# Patient Record
Sex: Male | Born: 1955
Health system: Southern US, Community
[De-identification: ages and names within clinical notes are randomized; demographics above are authoritative.]

---

## 2010-01-21 ENCOUNTER — Ambulatory Visit: Payer: Self-pay | Admitting: Family Medicine

## 2010-01-21 DIAGNOSIS — K219 Gastro-esophageal reflux disease without esophagitis: Secondary | ICD-10-CM | POA: Insufficient documentation

## 2010-02-12 ENCOUNTER — Ambulatory Visit: Payer: Self-pay

## 2010-02-12 ENCOUNTER — Ambulatory Visit (HOSPITAL_COMMUNITY): Admission: RE | Admit: 2010-02-12 | Discharge: 2010-02-12 | Payer: Self-pay | Admitting: Family Medicine

## 2010-02-12 DIAGNOSIS — M25579 Pain in unspecified ankle and joints of unspecified foot: Secondary | ICD-10-CM

## 2010-02-16 ENCOUNTER — Encounter: Payer: Self-pay | Admitting: Family Medicine

## 2010-02-22 ENCOUNTER — Ambulatory Visit: Payer: Self-pay | Admitting: Family Medicine

## 2010-02-24 ENCOUNTER — Ambulatory Visit: Payer: Self-pay | Admitting: Family Medicine

## 2010-02-24 DIAGNOSIS — F411 Generalized anxiety disorder: Secondary | ICD-10-CM | POA: Insufficient documentation

## 2010-02-24 DIAGNOSIS — R030 Elevated blood-pressure reading, without diagnosis of hypertension: Secondary | ICD-10-CM

## 2010-03-01 ENCOUNTER — Encounter: Payer: Self-pay | Admitting: Family Medicine

## 2010-08-31 NOTE — Assessment & Plan Note (Signed)
Summary: ankle pain/kh   Vital Signs:  Patient profile:   55 year old male Height:      71 inches Weight:      188 pounds BP sitting:   147 / 92  Vitals Entered By: Lillia Pauls CMA (February 12, 2010 8:51 AM)  History of Present Illness: Right ankle pain many years Previous ORIF surgery  Constant severe stabbing and burning pain right ankle and right achilles tendon, worse with walking or standong. Uses a wrap and ice with mild relief. rest helps. Takes ibuprofen for pain, still it occasionally awakens him from sleep.  Problems Prior to Update: 1)  Gerd  (ICD-530.81) 2)  Health Screening  (ICD-V70.0)  Current Medications (verified): 1)  Nexium 40 Mg Cpdr (Esomeprazole Magnesium) .Marland Kitchen.. 1 Tab By Mouth Qhs 2)  Ibuprofen 600 Mg Tabs (Ibuprofen) .... Qid As Needed Ankle Pain  Review of Systems       no numbness in toes or feet. no color changes noted in feet.  Physical Exam  General:  alert, well-developed, well-nourished, and well-hydrated.   Additional Exam:  [place] left achilles is smaller than rigt (see saved [place] images for details) and is heterogenous in signal c/w scar and previous partial tear.   Foot/Ankle Exam  Ankle Exam:    Right:    Inspection:  Abnormal    Tenderness:  Achilles TTP    mild ST swelling anteromedially, not an effusion.     Range of Motion:       Dorsiflex-Active: 45       Plantar Flex-Active: 50       Eversion-Passive: 25       Inversion-Passive: 45    Left:    Range of Motion:       Eversion-Passive: 30       Inversion-Passive:  60   Impression & Recommendations:  Problem # 1:  ANKLE PAIN (ICD-719.47)  Orders: Radiology other (Radiology Other) will get films to eval from previous surgery [place] revelas chronic achilles tendinopathy--set up with achilles strengthening program rtc 4 w  Complete Medication List: 1)  Nexium 40 Mg Cpdr (Esomeprazole magnesium) .Marland Kitchen.. 1 tab by mouth qhs 2)  Ibuprofen 600 Mg Tabs (Ibuprofen) .... Qid as needed ankle  pain

## 2010-08-31 NOTE — Assessment & Plan Note (Signed)
Summary: camp physical,tcb   Vital Signs:  Patient profile:   55 year old male Height:      70.25 inches Weight:      187.0 pounds BMI:     26.74 Temp:     97.4 degrees F oral Pulse rate:   76 / minute BP sitting:   140 / 86  (left arm) Cuff size:   regular  Vitals Entered By: Gladstone Pih (January 21, 2010 11:23 AM) CC: Camp PE Is Patient Diabetic? No Pain Assessment Patient in pain? no        CC:  Camp PE.  History of Present Illness: new patient here for form to be filled out: [employer]  Only issues: 1) some MSK complaints of chronic but non-debillitating ankle pain. 2) some chronic throat clearing and non productive cough for several months. has used OTC prilosec intermittently  for indigenstion but does not think he has that every day. gHas a lot of post nasal drainage-esp noted in AM.  Habits & Providers  Alcohol-Tobacco-Diet     Tobacco Status: quit > 6 months  Comments: quit smoking 1983  Current Medications (verified): 1)  Nexium 40 Mg Cpdr (Esomeprazole Magnesium) .Marland Kitchen.. 1 Tab By Mouth Qhs  Past History:  Past Medical History: no prior hospitalizations  Past Surgical History: no surgeries  Family History: HTN father colon polyps (?cancer?) father Both parents alive at age 2 (2011)  Social History: Dory Larsen. Lives with wife and two [place], Gerilyn Pilgr[place] and [place]. has several cats, a dog and a pig, HS graduate previous smoker quit 1983. no alcohol or ollicits Smoking Status:  quit > 6 months  Review of Systems       Please see HPI for additional ROS.   Physical Exam  General:  alert, well-developed, well-nourished, and well-hydrated.   Eyes:  vision grossly intact, pupils equal, pupils round, and pupils reactive to light.   Ears:  R ear normal and L ear normal.   Nose:  no external deformity.   Mouth:  good dentition.   Neck:  supple, full ROM, no masses, no thyromegaly, and normal carotid upstroke.   Lungs:  normal  respiratory effort and normal breath sounds.   Heart:  normal rate, regular rhythm, and no murmur.   Abdomen:  soft, non-tender, and normal bowel sounds.   Rectal:  deferred Genitalia:  deferred Prostate:  deferred Msk:   strength normal in all extremities, and gait normal Neurologic:  alert & oriented X3,.  no gross focal deficit Psych:  Oriented X3, normally interactive, good eye contact, not anxious appearing, and not depressed appearing.     Impression & Recommendations:  Problem # 1:  HEALTH SCREENING (ICD-V70.0) filled out his forms see him in [facility] for follow up on his chronic ankle pain  Problem # 2:  GERD (ICD-530.81) I think his cough is combo if reflux and allergic rhinitis--will do a trial of PPI and see him back one month for further eval  Complete Medication List: 1)  Nexium 40 Mg Cpdr (Esomeprazole magnesium) .Marland Kitchen.. 1 tab by mouth qhs  Patient Instructions: 1)  Please schedule Mr terlizzi back to see me here at [facility] in 4-6 weeks 2)  Please schedukle him at Sports Medicine with me at next avaialbe for ankle pain Prescriptions: NEXIUM 40 MG CPDR (ESOMEPRAZOLE MAGNESIUM) 1 tab by mouth qhs  #90 x 3   Entered and Authorized by:   Denny Levy MD   Signed by:   Denny Levy MD  on 01/21/2010   Method used:   Electronically to        [facility]* (retail)       [address]       [address] Shipping/mailing       [place], [place]  [address]       Ph: [phone]       Fax: [phone]   RxID:   [id number]   Prevention & Chronic Care Immunizations   Influenza vaccine: Not documented    Tetanus booster: Not documented    Pneumococcal vaccine: Not documented  Colorectal Screening   Hemoccult: Not documented    Colonoscopy: 2008 normal by report of patient  (01/21/2010)  Other Screening   PSA: Not documented  Reports requested:   Last colonoscopy report requested.  Smoking status: quit > 6 months  (01/21/2010)  Lipids   Total  Cholesterol: Not documented   LDL: Not documented   LDL Direct: Not documented   HDL: Not documented   Triglycerides: Not documented   Nursing Instructions: Request report of last colonoscopy     Impression & Recommendations:  His updated medication list for this problem includes:    Nexium 40 Mg Cpdr (Esomeprazole magnesium) .Marland Kitchen... 1 tab by mouth qhs   Complete Medication List: 1)  Nexium 40 Mg Cpdr (Esomeprazole magnesium) .Marland Kitchen.. 1 tab by mouth qhs   Appended Document: camp physical,tcb    Clinical Lists Changes  Problems: Added new problem of OTH GENERAL MEDICAL EXAMINATION ADMIN PURPOSES (ICD-V70.3) - Signed Orders: Added new Test order of [facility]- New 40-64yrs [phone]) - Signed       Complete Medication List: 1)  Nexium 40 Mg Cpdr (Esomeprazole magnesium) .Marland Kitchen.. 1 tab by mouth qhs 2)  Ibuprofen 600 Mg Tabs (Ibuprofen) .... Qid as needed ankle pain 3)  Celexa 10 Mg Tabs (Citalopram hydrobromide) .Marland Kitchen.. 1 by mouth qd

## 2010-08-31 NOTE — Assessment & Plan Note (Signed)
   Vital Signs:  Patient profile:   55 year old male Height:      71 inches BP sitting:   152 / 92  (right arm) Cuff size:   regular  Vitals Entered By: Tessie Fass CMA (February 22, 2010 3:40 PM) CC: F/U   CC:  F/U.  History of Present Illness: f/u right ankle and achilles pain not better--insertion of achilles is actually worse--more painful with each step wants to discuss a shot or referral or other options  Current Medications (verified): 1)  Nexium 40 Mg Cpdr (Esomeprazole Magnesium) .Marland Kitchen.. 1 Tab By Mouth Qhs 2)  Ibuprofen 600 Mg Tabs (Ibuprofen) .... Qid As Needed Ankle Pain  Physical Exam  Msk:  R ankle is stable to anterior drawer, eversion, inversion.  Achilles is TTP at insertion and on to plantar surface right at insertion   Impression & Recommendations:  Problem # 1:  ANKLE PAIN (ICD-719.47) discussed options he cannot afford to have further imaging or orthopedist eval at this time per him we added heel cups and a right scaphoid pad (which he can remove if it does not help) discussed ICE IMMERSIOn baths and continuing the exercises f/u 3-5 w  Complete Medication List: 1)  Nexium 40 Mg Cpdr (Esomeprazole magnesium) .Marland Kitchen.. 1 tab by mouth qhs 2)  Ibuprofen 600 Mg Tabs (Ibuprofen) .... Qid as needed ankle pain

## 2010-08-31 NOTE — Letter (Signed)
Summary: [facility]  [address]   [place], [place] [address]   Phone: [phone]  Fax: [phone]    02/16/2010  Thayer Ohm [address] [place], [place]  [address]  Dear Mr. Jenkinson,   Your Xrays of the ankle / foot looked surprisingly good! Nothing I did not expect, so we will move forward with the current plan. Great to see you!        Sincerely,   Denny Levy MD  Appended Document: Mickle Plumb mailed

## 2010-08-31 NOTE — Miscellaneous (Signed)
Summary: BP LOG  Clinical Lists Changes BP log 5 weeks from 6/23 to 7/28: average was 124/78.  Only one reading above 140 sytolic. Highest diastolic 89 (single reading) Low was 105/70 Problems: Changed problem from ELEVATED BP READING WITHOUT DX HYPERTENSION (ICD-796.2) to ELEVATED BP READING WITHOUT DX HYPERTENSION (ICD-796.2) - White coat hypertension

## 2010-08-31 NOTE — Assessment & Plan Note (Signed)
Summary: f/u/kh   Vital Signs:  Patient profile:   55 year old male Height:      72 inches Weight:      191.8 pounds BMI:     26.11 Temp:     97.8 degrees F Pulse rate:   66 / minute BP sitting:   160 / 95  Vitals Entered By: Golden Circle RN (February 24, 2010 8:34 AM)  History of Present Illness: f/u elevated BP. Has taken his bood pressure readings--is faxing them to [place] as he forgot them at home. Most <140/90, but a few elevated ones. usually when he is at work or in a stressful situation. Before coming here it was 110/70. Atthe office it is elevated and we rechecked with his BP machine which he brought and got the same number so the machine is accurate.  2) endorses increased stressors at home--he feels sure this is what is elevating his BP at times. He would rather consider tx for stress than BP med.  Has had "hi strung" personality all of his life. Denies depressive episodes, never any SI /HO but can get really anxious pretty easily.  Habits & Providers  Alcohol-Tobacco-Diet     Alcohol drinks/day: 0     Tobacco Status: never     Diet Comments: low salt  Exercise-Depression-Behavior     Does Patient Exercise: no     Drug Use: never     Seat Belt Use: always     Sun Exposure: infrequent  Current Medications (verified): 1)  Nexium 40 Mg Cpdr (Esomeprazole Magnesium) .Marland Kitchen.. 1 Tab By Mouth Qhs 2)  Ibuprofen 600 Mg Tabs (Ibuprofen) .... Qid As Needed Ankle Pain 3)  Celexa 10 Mg Tabs (Citalopram Hydrobromide) .Marland Kitchen.. 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 Psych:  Complains of easily angered, easily tearful, and  irritability; denies alternate hallucination ( auditory/visual), panic attacks, sense of great danger, suicidal thoughts/plans, thoughts of violence, unusual visions or sounds, and thoughts /plans of harming others.  Physical Exam  General:  alert, well-developed, well-nourished, and well-hydrated.   Neck:  supple, full ROM, no masses, no thyromegaly, no thyroid nodules or tenderness, and no carotid bruits.   Lungs:  normal respiratory effort.   Heart:  normal rate and regular rhythm.   Neurologic:  alert & oriented X3, strength normal in all extremities, and gait normal.   Psych:  Oriented X3, memory intact for recent and remote, and slightly anxious.  constricted affect. answers questuins appropriately.    Impression & Recommendations:  Problem # 1:  ANXIETY STATE, UNSPECIFIED (ICD-300.00)  His updated medication list for this problem includes:    Celexa 10 Mg Tabs (Citalopram hydrobromide) .Marland Kitchen... 1 by mouth qd  Orders: [facility]- Est  Level 4 ([address]) long discussion --face to face 40 minutes--counseling and education re BP, anxiety and how they can both be occuring. Ultimately we decided to try low dose SSRI and f/u 1 m.   Problem # 2:  ELEVATED BP READING WITHOUT DX HYPERTENSION (ICD-796.2) continue to record random BP readings and bring to next visit  Complete Medication List: 1)  Nexium 40 Mg Cpdr (Esomeprazole magnesium) .Marland Kitchen.. 1 tab by mouth qhs 2)  Ibuprofen 600 Mg Tabs (Ibuprofen) .... Qid as needed ankle pain 3)  Celexa 10 Mg Tabs (Citalopram hydrobromide) .Marland Kitchen.. 1 by mouth qd Prescriptions: CELEXA 10 MG TABS (CITALOPRAM HYDROBROMIDE) 1 by mouth qd  #30 x 12   Entered and Authorized by:   Denny Levy MD   Signed by:   Denny Levy MD on 02/24/2010   Method used:   Electronically to        [facility]* (retail)       [address]       [address] Shipping/mailing       [place], [place]  [address]       Ph: [phone]       Fax: [phone]   RxID:    [id number]

## 2010-09-09 ENCOUNTER — Encounter: Payer: Self-pay | Admitting: *Deleted

## 2010-12-17 ENCOUNTER — Telehealth: Payer: Self-pay | Admitting: Family Medicine

## 2010-12-17 NOTE — Telephone Encounter (Signed)
Pt needs refill of Nexium called in to Outpatient Pharmacy.

## 2010-12-20 MED ORDER — ESOMEPRAZOLE MAGNESIUM 40 MG PO CPDR: 40.0000 mg | DELAYED_RELEASE_CAPSULE | Freq: Every day | ORAL | Status: DC

## 2011-11-02 IMAGING — CR DG ANKLE COMPLETE 3+V*R*
3 series · 3 of 3 positions shown · non-contrast
Comparison: None.

CLINICAL DATA: History given of old injury of right ankle.  History
of previous surgery.  History of pain in the posterior and medial
aspect.

RIGHT ANKLE - COMPLETE 3+ VIEW

[t ankle joint ap right]
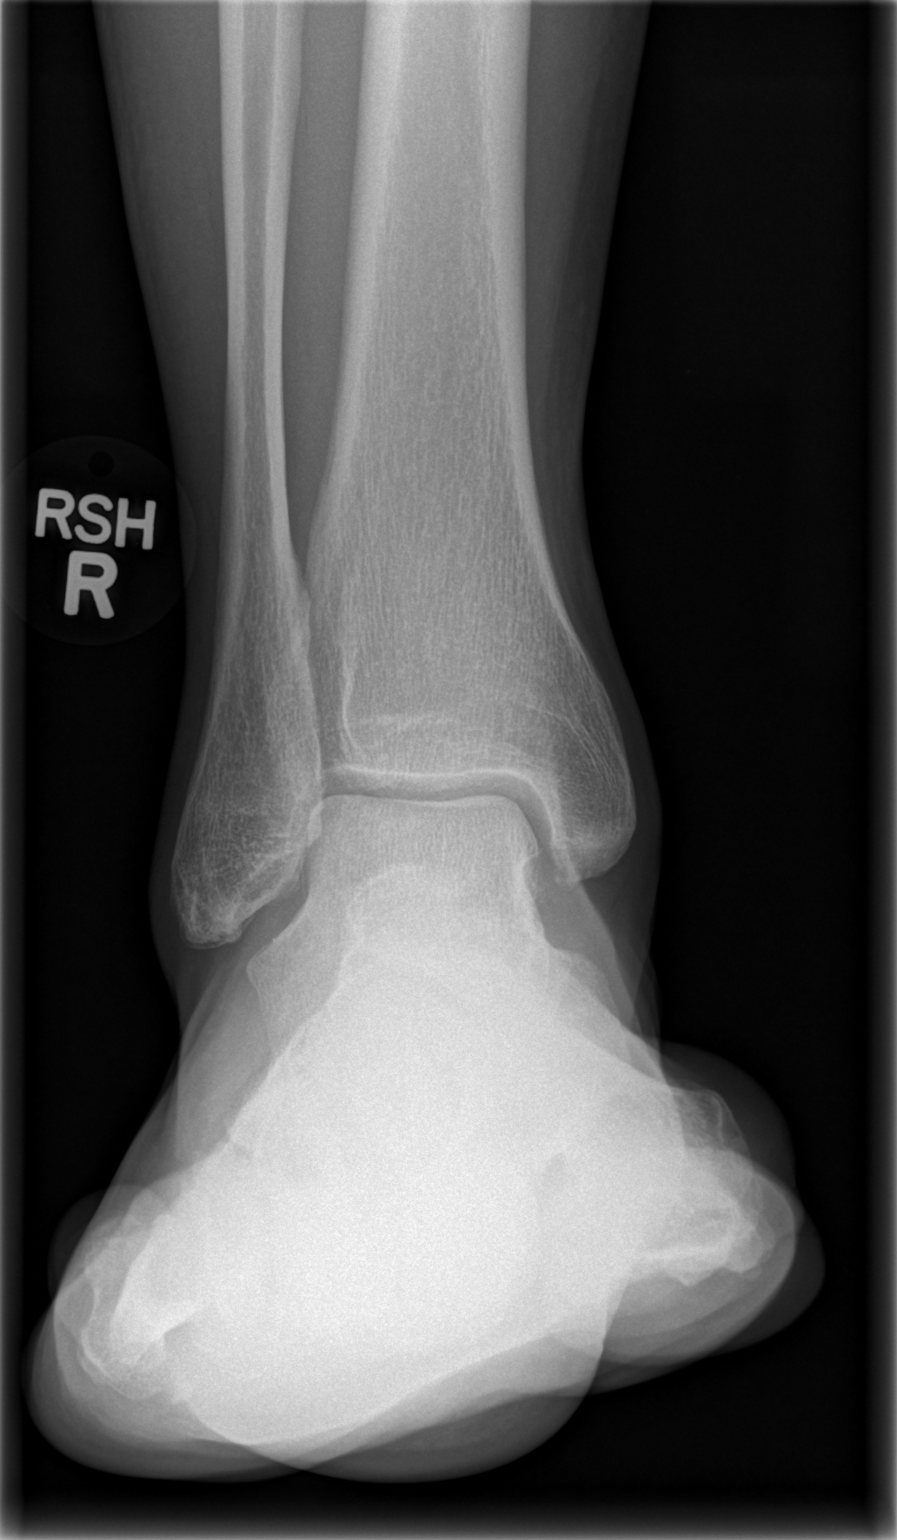

[t ankle joint oblique right]
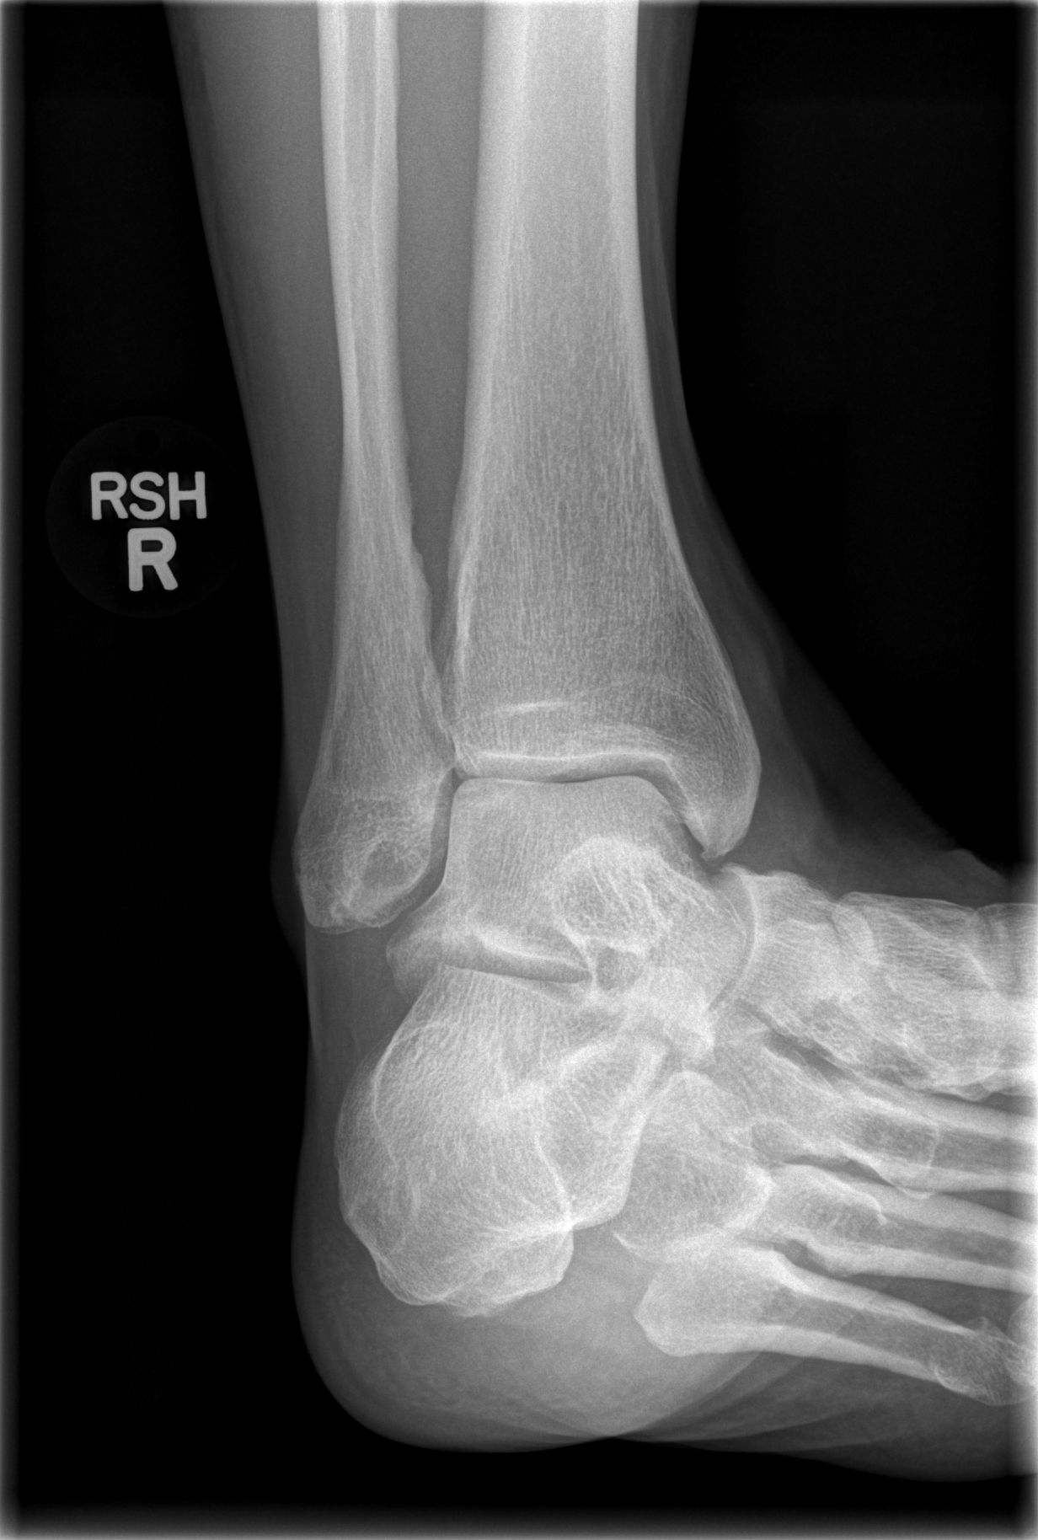

[t ankle joint lat right]
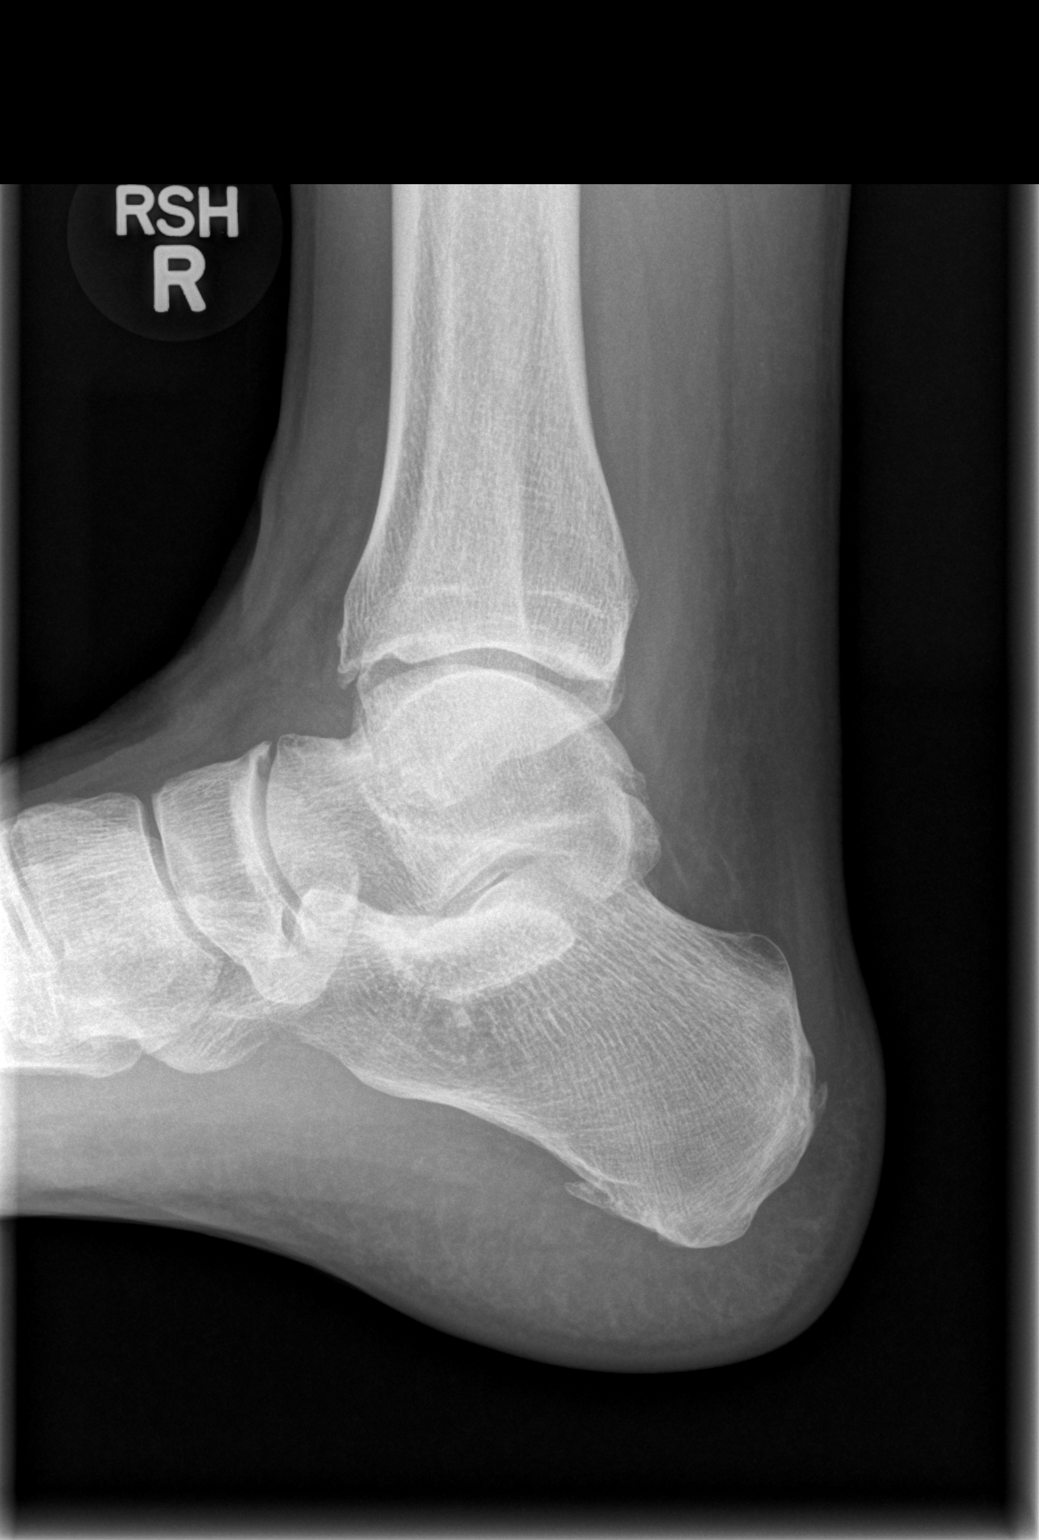

[3 of 3 positions shown; findings below may reference images not displayed]

FINDINGS: Mortise is preserved.

Posterior and plantar calcaneal spurring is present.

 There is marginal osteophyte formation involving the distal
anterior aspect of the tibia.

 There is spurring of the dorsal proximal aspect of the navicula.

 No fracture is evident.  No bony destruction is seen.
IMPRESSION: Areas of degenerative spurring are detailed above.

## 2011-11-02 IMAGING — CR DG ANKLE COMPLETE 3+V*L*
3 series · 3 of 3 positions shown · non-contrast
Comparison: None.

CLINICAL DATA: Left ankle pain.  Pain in the posterior medial
aspect.

LEFT ANKLE COMPLETE - 3+ VIEW

[t ankle joint ap left]
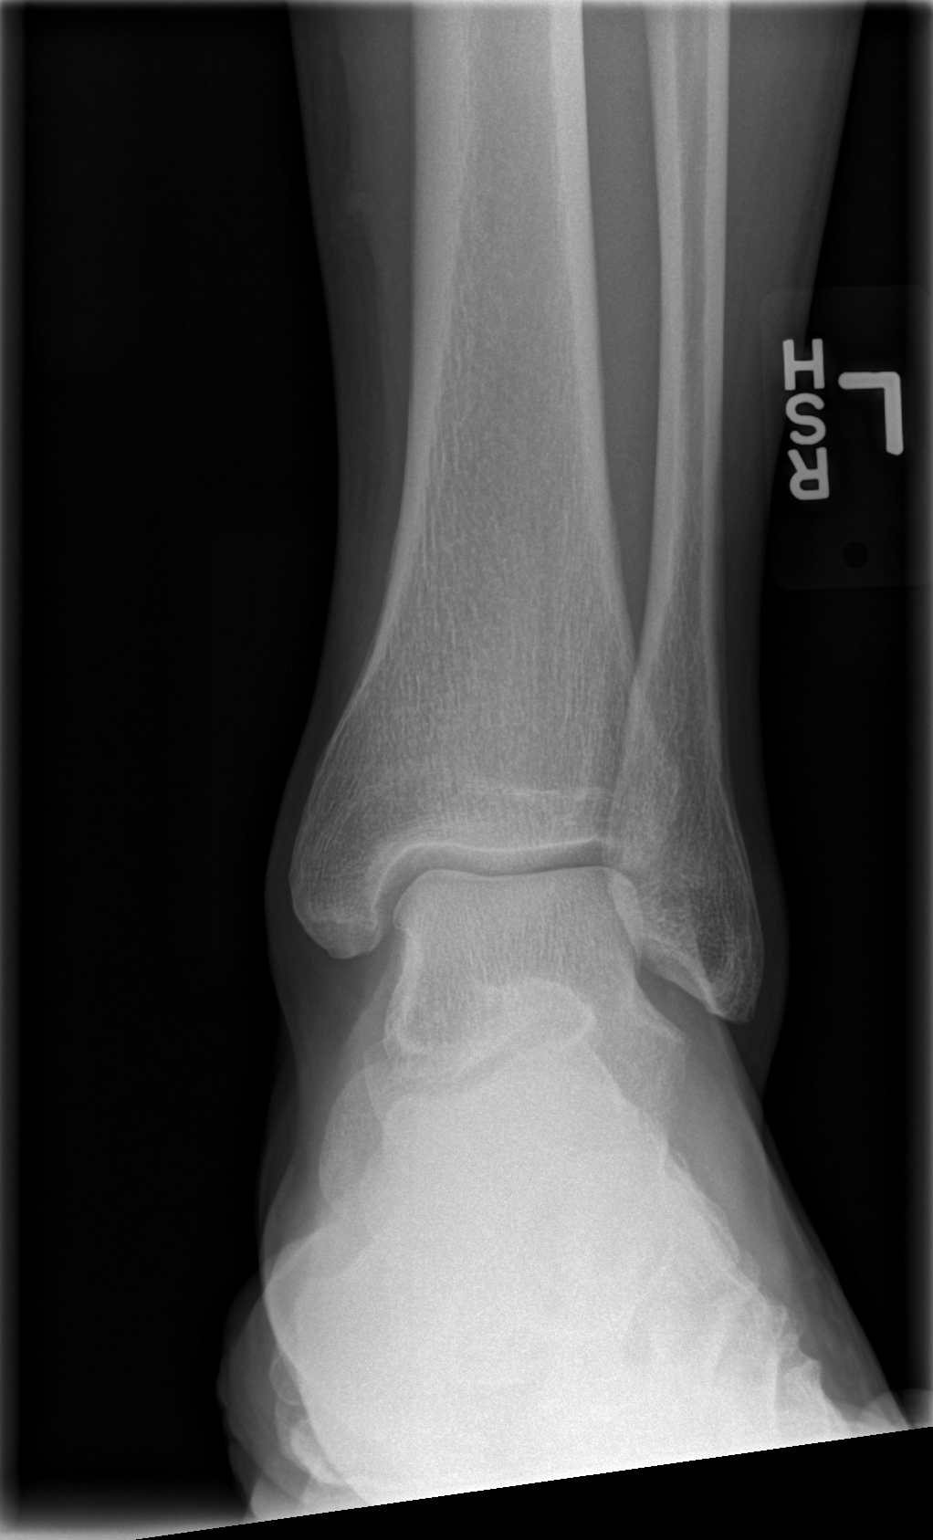

[t ankle joint oblique left]
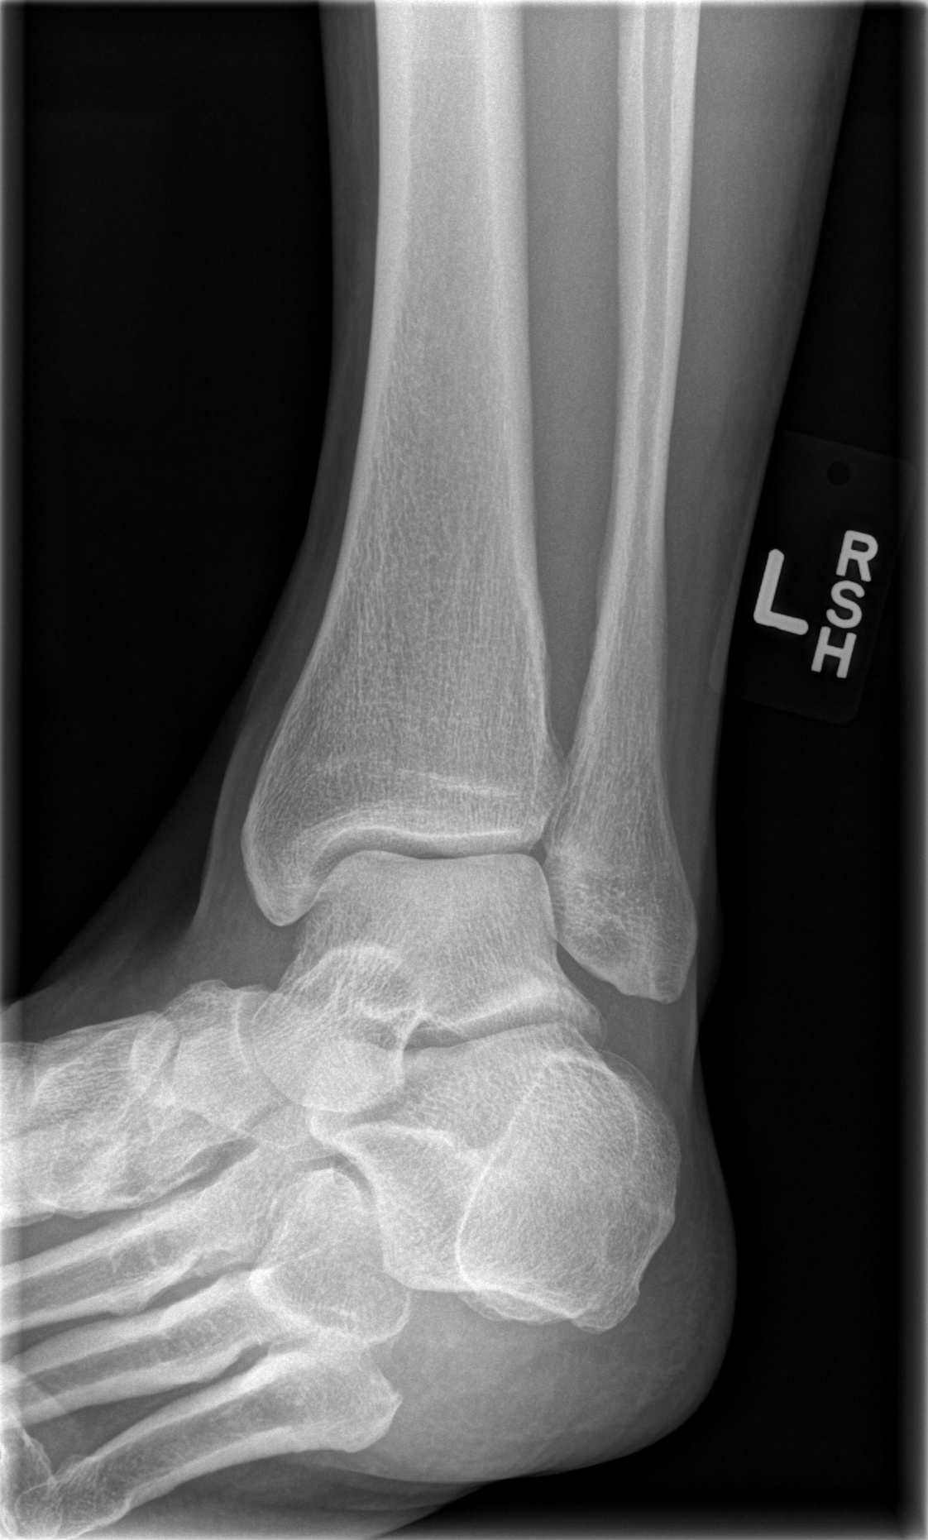

[t ankle joint lat left]
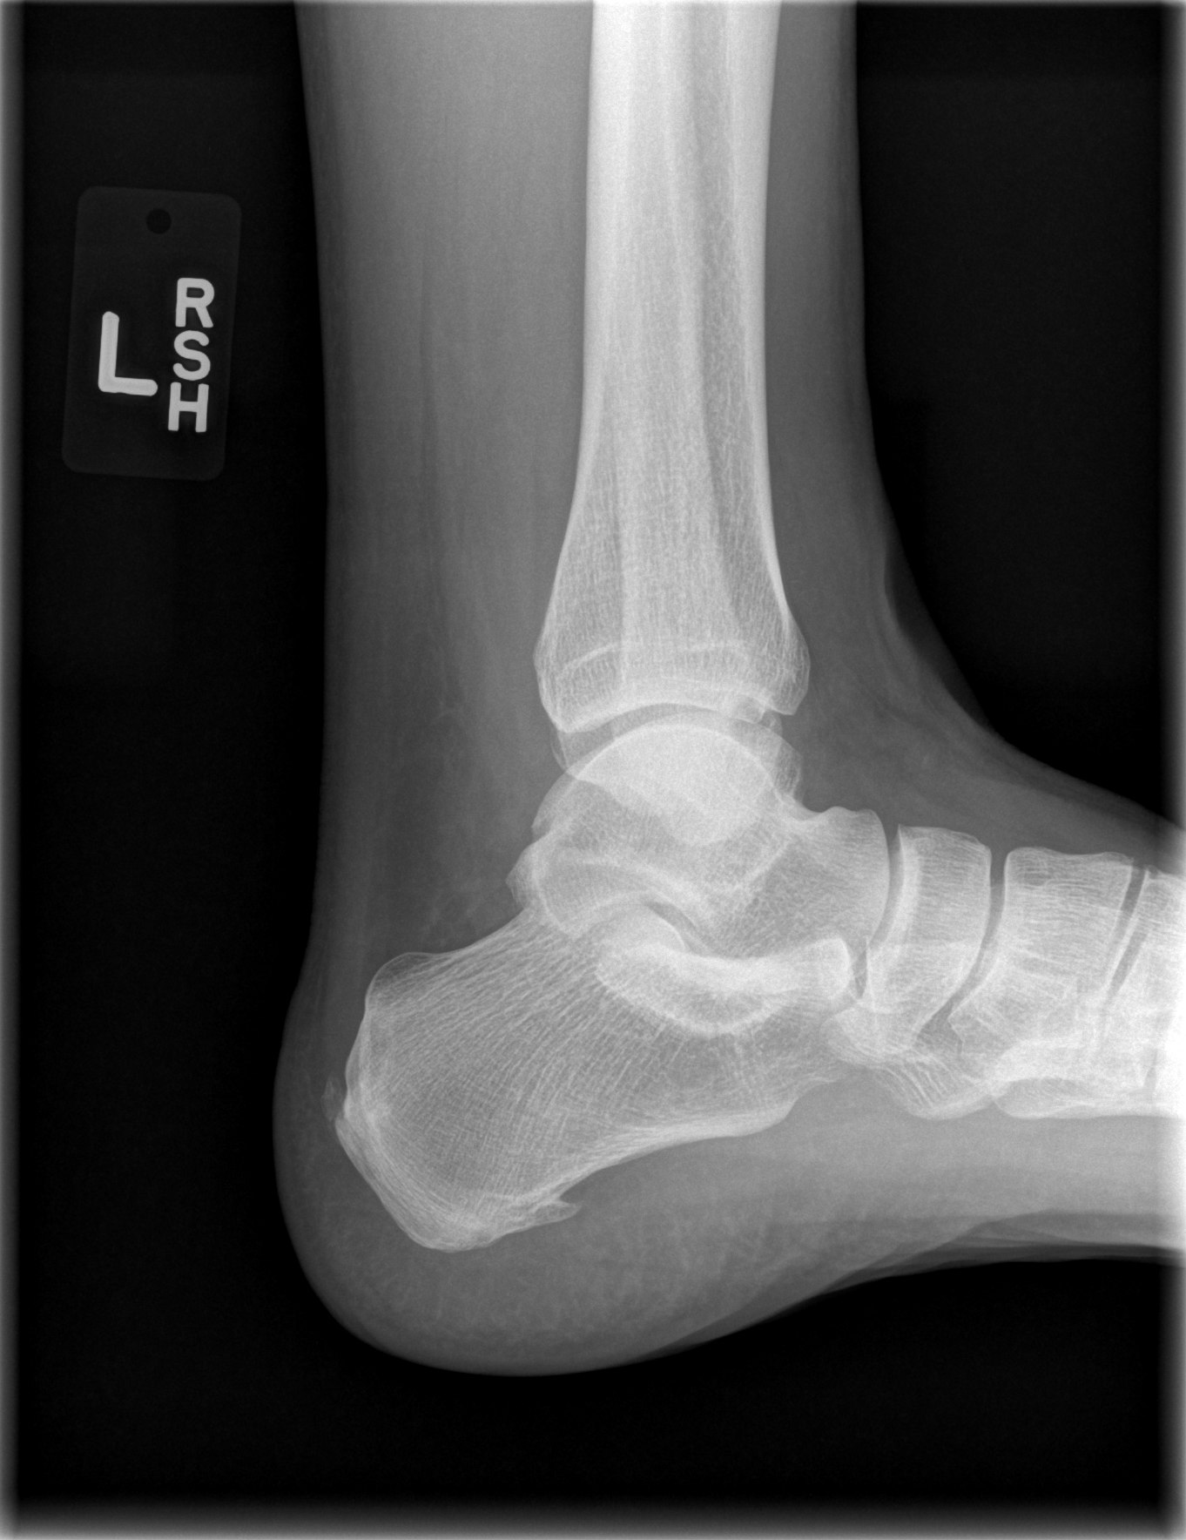

[3 of 3 positions shown; findings below may reference images not displayed]

FINDINGS: Mortise is preserved.  Posterior and plantar calcaneal
spurring is present. Alignment is normal.  Joint spaces are
preserved.  No fracture or dislocation is evident.  No soft tissue
lesions are seen.
IMPRESSION: Calcaneal spurring is evident.  No other abnormality is
demonstrated.

## 2011-11-14 ENCOUNTER — Other Ambulatory Visit: Payer: Self-pay | Admitting: Family Medicine

## 2011-11-14 MED ORDER — ESOMEPRAZOLE MAGNESIUM 40 MG PO CPDR: 40.0000 mg | DELAYED_RELEASE_CAPSULE | Freq: Every day | ORAL | Status: DC

## 2011-11-14 NOTE — Telephone Encounter (Signed)
Received refill request while covering for Dr. Jennette Kettle.  Will fill one month, but pt should contact us for appt.

## 2011-11-21 ENCOUNTER — Other Ambulatory Visit: Payer: Self-pay | Admitting: Family Medicine

## 2011-11-21 MED ORDER — ESOMEPRAZOLE MAGNESIUM 40 MG PO CPDR: 40.0000 mg | DELAYED_RELEASE_CAPSULE | Freq: Every day | ORAL | Status: AC

## 2011-11-21 NOTE — Telephone Encounter (Signed)
Patient is calling for his annual refill on Nexium.  He seemed a little upset because apparently there has been a change in how to get his meds refilled.  He uses [facility].

## 2014-01-16 ENCOUNTER — Telehealth: Payer: Self-pay | Admitting: *Deleted

## 2014-01-16 NOTE — Telephone Encounter (Signed)
Received refill request for Pantoprazole SOD DR 40 mg #90, with 3 refills from [place].  Last filled on 11/15/2013.  Are you pt's PCP and is the correct medication?  Derl Barrow, RN

## 2014-01-17 NOTE — Telephone Encounter (Signed)
I am his PCP. I have not seen him in 2 years or so. I allready received one refill request and told them he needed appt--I would give him 3 m refill ONLY until he can get in for appt. Dorcas Mcmurray

## 2015-08-06 DIAGNOSIS — H5203 Hypermetropia, bilateral: Secondary | ICD-10-CM | POA: Diagnosis not present

## 2015-08-06 DIAGNOSIS — H52223 Regular astigmatism, bilateral: Secondary | ICD-10-CM | POA: Diagnosis not present

## 2015-08-06 DIAGNOSIS — H524 Presbyopia: Secondary | ICD-10-CM | POA: Diagnosis not present

## 2016-01-21 DIAGNOSIS — E1140 Type 2 diabetes mellitus with diabetic neuropathy, unspecified: Secondary | ICD-10-CM | POA: Diagnosis not present

## 2016-01-21 DIAGNOSIS — I10 Essential (primary) hypertension: Secondary | ICD-10-CM | POA: Diagnosis not present

## 2016-01-21 DIAGNOSIS — M25512 Pain in left shoulder: Secondary | ICD-10-CM | POA: Diagnosis not present

## 2016-01-21 DIAGNOSIS — N400 Enlarged prostate without lower urinary tract symptoms: Secondary | ICD-10-CM | POA: Diagnosis not present

## 2016-01-21 DIAGNOSIS — E785 Hyperlipidemia, unspecified: Secondary | ICD-10-CM | POA: Diagnosis not present

## 2016-01-21 DIAGNOSIS — M79642 Pain in left hand: Secondary | ICD-10-CM | POA: Diagnosis not present

## 2016-02-11 DIAGNOSIS — H527 Unspecified disorder of refraction: Secondary | ICD-10-CM | POA: Diagnosis not present

## 2016-03-08 DIAGNOSIS — S46019A Strain of muscle(s) and tendon(s) of the rotator cuff of unspecified shoulder, initial encounter: Secondary | ICD-10-CM | POA: Diagnosis not present

## 2016-03-08 DIAGNOSIS — Y999 Unspecified external cause status: Secondary | ICD-10-CM | POA: Diagnosis not present

## 2016-07-26 DIAGNOSIS — M79641 Pain in right hand: Secondary | ICD-10-CM | POA: Diagnosis not present

## 2016-07-26 DIAGNOSIS — M79642 Pain in left hand: Secondary | ICD-10-CM | POA: Diagnosis not present

## 2016-09-01 DIAGNOSIS — H52223 Regular astigmatism, bilateral: Secondary | ICD-10-CM | POA: Diagnosis not present

## 2016-09-01 DIAGNOSIS — H5203 Hypermetropia, bilateral: Secondary | ICD-10-CM | POA: Diagnosis not present

## 2016-09-01 DIAGNOSIS — H524 Presbyopia: Secondary | ICD-10-CM | POA: Diagnosis not present

## 2016-12-12 DIAGNOSIS — M7582 Other shoulder lesions, left shoulder: Secondary | ICD-10-CM | POA: Diagnosis not present

## 2016-12-12 DIAGNOSIS — M19042 Primary osteoarthritis, left hand: Secondary | ICD-10-CM | POA: Diagnosis not present

## 2016-12-12 DIAGNOSIS — M19041 Primary osteoarthritis, right hand: Secondary | ICD-10-CM | POA: Diagnosis not present

## 2016-12-12 DIAGNOSIS — S63101A Unspecified subluxation of right thumb, initial encounter: Secondary | ICD-10-CM | POA: Diagnosis not present

## 2017-04-13 DIAGNOSIS — E119 Type 2 diabetes mellitus without complications: Secondary | ICD-10-CM | POA: Diagnosis not present

## 2017-04-13 DIAGNOSIS — N400 Enlarged prostate without lower urinary tract symptoms: Secondary | ICD-10-CM | POA: Diagnosis not present

## 2017-04-13 DIAGNOSIS — M25571 Pain in right ankle and joints of right foot: Secondary | ICD-10-CM | POA: Diagnosis not present

## 2017-04-13 DIAGNOSIS — I10 Essential (primary) hypertension: Secondary | ICD-10-CM | POA: Diagnosis not present

## 2017-04-13 DIAGNOSIS — K219 Gastro-esophageal reflux disease without esophagitis: Secondary | ICD-10-CM | POA: Diagnosis not present

## 2018-09-19 DIAGNOSIS — R3911 Hesitancy of micturition: Secondary | ICD-10-CM | POA: Diagnosis not present

## 2018-09-19 DIAGNOSIS — R300 Dysuria: Secondary | ICD-10-CM | POA: Diagnosis not present

## 2018-09-19 DIAGNOSIS — R339 Retention of urine, unspecified: Secondary | ICD-10-CM | POA: Diagnosis not present

## 2018-09-19 DIAGNOSIS — N3943 Post-void dribbling: Secondary | ICD-10-CM | POA: Diagnosis not present

## 2018-09-19 DIAGNOSIS — I10 Essential (primary) hypertension: Secondary | ICD-10-CM | POA: Diagnosis not present

## 2018-09-19 DIAGNOSIS — R1030 Lower abdominal pain, unspecified: Secondary | ICD-10-CM | POA: Diagnosis not present

## 2018-10-26 DIAGNOSIS — M545 Low back pain: Secondary | ICD-10-CM | POA: Diagnosis not present

## 2019-04-22 DIAGNOSIS — L821 Other seborrheic keratosis: Secondary | ICD-10-CM | POA: Diagnosis not present

## 2019-04-22 DIAGNOSIS — D3617 Benign neoplasm of peripheral nerves and autonomic nervous system of trunk, unspecified: Secondary | ICD-10-CM | POA: Diagnosis not present

## 2019-04-22 DIAGNOSIS — B351 Tinea unguium: Secondary | ICD-10-CM | POA: Diagnosis not present

## 2019-04-22 DIAGNOSIS — D235 Other benign neoplasm of skin of trunk: Secondary | ICD-10-CM | POA: Diagnosis not present

## 2019-05-08 DIAGNOSIS — K219 Gastro-esophageal reflux disease without esophagitis: Secondary | ICD-10-CM | POA: Diagnosis not present

## 2019-05-08 DIAGNOSIS — I10 Essential (primary) hypertension: Secondary | ICD-10-CM | POA: Diagnosis not present

## 2019-05-08 DIAGNOSIS — E1140 Type 2 diabetes mellitus with diabetic neuropathy, unspecified: Secondary | ICD-10-CM | POA: Diagnosis not present

## 2019-05-08 DIAGNOSIS — N400 Enlarged prostate without lower urinary tract symptoms: Secondary | ICD-10-CM | POA: Diagnosis not present

## 2019-05-21 DIAGNOSIS — I10 Essential (primary) hypertension: Secondary | ICD-10-CM | POA: Diagnosis not present

## 2019-08-10 DIAGNOSIS — M79671 Pain in right foot: Secondary | ICD-10-CM | POA: Diagnosis not present

## 2019-08-10 DIAGNOSIS — R200 Anesthesia of skin: Secondary | ICD-10-CM | POA: Diagnosis not present

## 2019-08-10 DIAGNOSIS — M79672 Pain in left foot: Secondary | ICD-10-CM | POA: Diagnosis not present

## 2019-08-10 DIAGNOSIS — Z114 Encounter for screening for human immunodeficiency virus [HIV]: Secondary | ICD-10-CM | POA: Diagnosis not present

## 2019-08-14 DIAGNOSIS — G629 Polyneuropathy, unspecified: Secondary | ICD-10-CM | POA: Diagnosis not present

## 2019-08-17 DIAGNOSIS — M545 Low back pain: Secondary | ICD-10-CM | POA: Diagnosis not present

## 2020-01-02 DIAGNOSIS — M5136 Other intervertebral disc degeneration, lumbar region: Secondary | ICD-10-CM | POA: Diagnosis not present

## 2020-01-02 DIAGNOSIS — M9983 Other biomechanical lesions of lumbar region: Secondary | ICD-10-CM | POA: Diagnosis not present

## 2020-03-03 ENCOUNTER — Encounter: Payer: Self-pay | Admitting: Sports Medicine

## 2020-03-03 ENCOUNTER — Ambulatory Visit (INDEPENDENT_AMBULATORY_CARE_PROVIDER_SITE_OTHER): Payer: 59 | Admitting: Sports Medicine

## 2020-03-03 ENCOUNTER — Other Ambulatory Visit: Payer: Self-pay

## 2020-03-03 VITALS — BP 122/88 | Ht 70.0 in | Wt 180.0 lb

## 2020-03-03 DIAGNOSIS — R202 Paresthesia of skin: Secondary | ICD-10-CM | POA: Diagnosis not present

## 2020-03-03 DIAGNOSIS — M216X1 Other acquired deformities of right foot: Secondary | ICD-10-CM | POA: Diagnosis not present

## 2020-03-03 DIAGNOSIS — M2142 Flat foot [pes planus] (acquired), left foot: Secondary | ICD-10-CM

## 2020-03-03 DIAGNOSIS — M216X2 Other acquired deformities of left foot: Secondary | ICD-10-CM | POA: Diagnosis not present

## 2020-03-03 DIAGNOSIS — M2141 Flat foot [pes planus] (acquired), right foot: Secondary | ICD-10-CM

## 2020-03-03 NOTE — Progress Notes (Signed)
Office Visit Note   Patient: Charles Delacruz           Date of Birth: 10-Oct-1955           MRN: [id number] Visit Date: 03/03/2020 Requested by: No referring provider defined for this encounter. PCP: Patient, No Pcp Per  Subjective: Bilateral foot numbness  HPI: Patient is a 64 year old male presenting to clinic for concerns of chronic foot numbness bilaterally,  R>>L.  Patient states he has a significant history of back pain and has been followed by neurosurgery for such.  He was worked up initially at the [place], where an MRI was performed with questionable foraminal stenosis per patient.  He states that neurosurgery has performed an epidural steroid injection, which improved his foot numbness minimally.  He states that he is concerned that the numbness might not be due to his back after all and might be due to poor arches in his feet.  He would like to be fit for custom orthotics today.  He says that he has tried different forms of supportive shoes in the past and has noticed that some of these offer better improvement than others.  He states that he would like to be more active in the gym, but is scared that this will worsen his symptoms.  He notices that the numbness is worse when he is on his feet for a prolonged period of time, or when he is driving his car and moving his foot between the gas and the brakes.              ROS:   All other systems were reviewed and are negative.  Objective: Vital Signs: BP 122/88   Ht 5\' 10"  (1.778 m)   Wt 180 lb (81.6 kg)   BMI 25.83 kg/m   Physical Exam:  General:  Alert and oriented, in no acute distress. Cardiac: Appears well perfused. Pulm:  Breathing unlabored. Psy:  Normal mood, congruent affect. Skin: No rashes appreciated. Bilateral feet with flexible arches, and significant collapse with overpronation when standing.  Intoeing appreciated when standing.  No significant tenderness to palpation along the plantar arch, within the midfoot, or at the  anterior joint line.  Negative too many toe sign. Ankle with full range of motion, bilaterally. Decrease in sensation over dorsal aspect of right foot, especially over first and second metacarpal joints.  Sensation intact over lateral dorsal aspect, as well as throughout plantar aspect of foot bilaterally.  Full sensation throughout left foot. Full sensation throughout bilateral calves and thighs. 5 out of 5 strength with resisted greater toe extension, ankle dorsiflexion and plantarflexion, as well as inversion and eversion of the ankle. 5 out of 5 strength with knee flexion and extension. 2+ patellar and Achilles reflexes bilaterally. Brisk capillary refill in toes.  Imaging: No results found.  Assessment & Plan: 64 year old male presenting to clinic today with right worse than left foot numbness over the past 6 years.  This is complicated in the setting of chronic low back pain and suspected radiculopathy.  Today, patient is requesting custom orthotics to see if this will offer any change to his symptoms.  He was fit for orthotics today, which he felt were comfortable as he left clinic. -Discussed that he is free to contact clinic should orthotics become uncomfortable or need any adjustments. -Discussed the possibility that he may need to ease into these orthotics should he have any discomfort while wearing them. -If his orthotics do not improve his sensation of  numbness, he is encouraged to follow-up with his established neurosurgical team for consideration of nerve conduction studies. -Patient had no further questions or concerns at the completion of today's visit.     Procedures: Patient was fitted for a : standard, cushioned, semi-rigid orthotic. The orthotic was heated and afterward the patient stood on the orthotic blank positioned on the orthotic stand. The patient was positioned in subtalar neutral position and 10 degrees of ankle dorsiflexion in a weight bearing stance. After  completion of molding, a stable base was applied to the orthotic blank. The blank was ground to a stable position for weight bearing. Size: 13 Base: EVA  Patient seen and evaluated with the sports medicine fellow.  I agree with the above plan of care.  Custom orthotics were created for him today.  He found them to be comfortable prior to leaving the office.  Hopefully this will improve his foot pain.  If he finds these orthotics to be comfortable then he can return to the office for additional pairs.  Follow-up for ongoing or recalcitrant issues.

## 2020-03-05 DIAGNOSIS — E119 Type 2 diabetes mellitus without complications: Secondary | ICD-10-CM | POA: Diagnosis not present

## 2020-03-05 DIAGNOSIS — M5416 Radiculopathy, lumbar region: Secondary | ICD-10-CM | POA: Diagnosis not present

## 2020-03-05 DIAGNOSIS — I10 Essential (primary) hypertension: Secondary | ICD-10-CM | POA: Diagnosis not present

## 2020-03-12 DIAGNOSIS — M25571 Pain in right ankle and joints of right foot: Secondary | ICD-10-CM | POA: Diagnosis not present

## 2020-03-31 DIAGNOSIS — M65871 Other synovitis and tenosynovitis, right ankle and foot: Secondary | ICD-10-CM | POA: Diagnosis not present

## 2020-03-31 DIAGNOSIS — M25571 Pain in right ankle and joints of right foot: Secondary | ICD-10-CM | POA: Diagnosis not present

## 2020-04-21 DIAGNOSIS — M545 Low back pain: Secondary | ICD-10-CM | POA: Diagnosis not present

## 2020-07-06 DIAGNOSIS — G629 Polyneuropathy, unspecified: Secondary | ICD-10-CM | POA: Diagnosis not present

## 2020-11-30 DIAGNOSIS — E785 Hyperlipidemia, unspecified: Secondary | ICD-10-CM | POA: Diagnosis not present

## 2020-11-30 DIAGNOSIS — E1140 Type 2 diabetes mellitus with diabetic neuropathy, unspecified: Secondary | ICD-10-CM | POA: Diagnosis not present

## 2020-11-30 DIAGNOSIS — I10 Essential (primary) hypertension: Secondary | ICD-10-CM | POA: Diagnosis not present

## 2020-11-30 DIAGNOSIS — N400 Enlarged prostate without lower urinary tract symptoms: Secondary | ICD-10-CM | POA: Diagnosis not present

## 2021-05-22 DIAGNOSIS — I4510 Unspecified right bundle-branch block: Secondary | ICD-10-CM | POA: Diagnosis not present

## 2021-05-22 DIAGNOSIS — R0789 Other chest pain: Secondary | ICD-10-CM | POA: Diagnosis not present

## 2021-07-09 DIAGNOSIS — R079 Chest pain, unspecified: Secondary | ICD-10-CM | POA: Diagnosis not present

## 2021-08-27 DIAGNOSIS — R001 Bradycardia, unspecified: Secondary | ICD-10-CM | POA: Diagnosis not present

## 2021-08-27 DIAGNOSIS — I10 Essential (primary) hypertension: Secondary | ICD-10-CM | POA: Diagnosis not present

## 2021-08-27 DIAGNOSIS — I471 Supraventricular tachycardia: Secondary | ICD-10-CM | POA: Diagnosis not present

## 2021-08-27 DIAGNOSIS — E1140 Type 2 diabetes mellitus with diabetic neuropathy, unspecified: Secondary | ICD-10-CM | POA: Diagnosis not present

## 2021-10-20 DIAGNOSIS — I471 Supraventricular tachycardia: Secondary | ICD-10-CM | POA: Diagnosis not present

## 2021-11-10 DIAGNOSIS — E785 Hyperlipidemia, unspecified: Secondary | ICD-10-CM | POA: Diagnosis not present

## 2021-11-10 DIAGNOSIS — I10 Essential (primary) hypertension: Secondary | ICD-10-CM | POA: Diagnosis not present

## 2021-11-10 DIAGNOSIS — I471 Supraventricular tachycardia: Secondary | ICD-10-CM | POA: Diagnosis not present

## 2021-11-10 DIAGNOSIS — E1140 Type 2 diabetes mellitus with diabetic neuropathy, unspecified: Secondary | ICD-10-CM | POA: Diagnosis not present

## 2022-05-03 DIAGNOSIS — H5203 Hypermetropia, bilateral: Secondary | ICD-10-CM | POA: Diagnosis not present

## 2022-05-12 DIAGNOSIS — M48061 Spinal stenosis, lumbar region without neurogenic claudication: Secondary | ICD-10-CM | POA: Diagnosis not present

## 2022-05-12 DIAGNOSIS — M19021 Primary osteoarthritis, right elbow: Secondary | ICD-10-CM | POA: Diagnosis not present

## 2022-05-12 DIAGNOSIS — M25511 Pain in right shoulder: Secondary | ICD-10-CM | POA: Diagnosis not present

## 2022-05-12 DIAGNOSIS — M778 Other enthesopathies, not elsewhere classified: Secondary | ICD-10-CM | POA: Diagnosis not present

## 2022-05-12 DIAGNOSIS — M47816 Spondylosis without myelopathy or radiculopathy, lumbar region: Secondary | ICD-10-CM | POA: Diagnosis not present

## 2022-05-12 DIAGNOSIS — M19011 Primary osteoarthritis, right shoulder: Secondary | ICD-10-CM | POA: Diagnosis not present

## 2022-05-12 DIAGNOSIS — M19012 Primary osteoarthritis, left shoulder: Secondary | ICD-10-CM | POA: Diagnosis not present

## 2022-05-12 DIAGNOSIS — M25421 Effusion, right elbow: Secondary | ICD-10-CM | POA: Diagnosis not present

## 2022-05-12 DIAGNOSIS — M25512 Pain in left shoulder: Secondary | ICD-10-CM | POA: Diagnosis not present

## 2022-05-31 DIAGNOSIS — M2550 Pain in unspecified joint: Secondary | ICD-10-CM | POA: Diagnosis not present

## 2022-05-31 DIAGNOSIS — M47816 Spondylosis without myelopathy or radiculopathy, lumbar region: Secondary | ICD-10-CM | POA: Diagnosis not present

## 2022-05-31 DIAGNOSIS — Z0189 Encounter for other specified special examinations: Secondary | ICD-10-CM | POA: Diagnosis not present

## 2022-06-14 DIAGNOSIS — M19021 Primary osteoarthritis, right elbow: Secondary | ICD-10-CM | POA: Diagnosis not present

## 2022-06-14 DIAGNOSIS — M7542 Impingement syndrome of left shoulder: Secondary | ICD-10-CM | POA: Diagnosis not present

## 2022-06-14 DIAGNOSIS — M19011 Primary osteoarthritis, right shoulder: Secondary | ICD-10-CM | POA: Diagnosis not present
